# Patient Record
Sex: Male | Born: 1995 | Race: White | Hispanic: No | Marital: Single | Smoking: Current every day smoker
Health system: Southern US, Community
[De-identification: ages and names within clinical notes are randomized; demographics above are authoritative.]

## PROBLEM LIST (undated history)

## (undated) DIAGNOSIS — I10 Essential (primary) hypertension: Secondary | ICD-10-CM

---
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
## 2018-08-13 ENCOUNTER — Emergency Department
Admission: EM | Admit: 2018-08-13 | Discharge: 2018-08-13 | Disposition: A | Discharge status: Home / Self Care | Payer: Self-pay | Attending: Emergency Medicine | Admitting: Emergency Medicine

## 2018-08-13 ENCOUNTER — Other Ambulatory Visit: Payer: Self-pay

## 2018-08-13 ENCOUNTER — Encounter: Payer: Self-pay | Admitting: Emergency Medicine

## 2018-08-13 ENCOUNTER — Emergency Department: Payer: Self-pay

## 2018-08-13 DIAGNOSIS — F17210 Nicotine dependence, cigarettes, uncomplicated: Secondary | ICD-10-CM | POA: Insufficient documentation

## 2018-08-13 DIAGNOSIS — G8929 Other chronic pain: Secondary | ICD-10-CM | POA: Insufficient documentation

## 2018-08-13 DIAGNOSIS — M25561 Pain in right knee: Secondary | ICD-10-CM | POA: Insufficient documentation

## 2018-08-13 MED ORDER — MELOXICAM 15 MG PO TABS
15.0000 mg | ORAL_TABLET | Freq: Every day | ORAL | 0 refills | Status: AC
Start: 2018-08-13 — End: 2019-08-13

## 2018-08-13 NOTE — ED Notes (Signed)
See triage note  Presents with right knee pain  States pain is chronic but pain became worse today at work  No deformity noted

## 2018-08-13 NOTE — Discharge Instructions (Signed)
Follow-up with Dr. Rosita Kea who is on-call for orthopedics.  Also have your blood pressure rechecked as it was elevated at 154/102 initially in triage.  Begin taking meloxicam 50 mg 1 daily with food.  This medication will take approximately 5 to 7 days for full effect so you may also take Tylenol with this medication if needed for pain.  You may ice your knee as needed for discomfort.

## 2018-08-13 NOTE — ED Triage Notes (Signed)
Pt in via POV with complaints of chronic right knee pain, becoming worse today while at work.  Pt ambulatory to triabe, vitals WDL, NAD noted at this time.

## 2018-08-13 NOTE — ED Notes (Signed)
See triage note, right knee pain - chronic pain, worse with ambulation.

## 2018-08-13 NOTE — ED Provider Notes (Signed)
[facility] Emergency Department Provider Note  ____________________________________________   First MD Initiated Contact with Patient 08/13/18 1303     (approximate)  I have reviewed the triage vital signs and the nursing notes.   HISTORY  Chief Complaint Knee Pain   HPI Alan Mcbride is a 22 y.o. male presents to the emergency department with complaint of chronic knee pain for 6 months.  Patient states that today at work his pain become worse.  He has been taking ibuprofen without any relief.  He denies any previous injury to his knee and this is the first time he has had his knee evaluated.  Currently he rates his pain as 3 out of 10.  History reviewed. No pertinent past medical history.  There are no active problems to display for this patient.   History reviewed. No pertinent surgical history.  Prior to Admission medications   Medication Sig Start Date End Date Taking? Authorizing Provider  meloxicam (MOBIC) 15 MG tablet Take 1 tablet (15 mg total) by mouth daily. 08/13/18 08/13/19  Tommi Rumps, PA-C    Allergies Patient has no known allergies.  No family history on file.  Social History Social History   Tobacco Use  . Smoking status: Current Every Day Smoker    Packs/day: 0.50    Types: Cigarettes  . Smokeless tobacco: Never Used  Substance Use Topics  . Alcohol use: Yes  . Drug use: Never    Review of Systems Constitutional: No fever/chills Cardiovascular: Denies chest pain. Respiratory: Denies shortness of breath. Musculoskeletal: Positive for right knee pain. Skin: Negative for rash. Neurological: Negative for headaches, focal weakness or numbness. ____________________________________________   PHYSICAL EXAM:  VITAL SIGNS: ED Triage Vitals  Enc Vitals Group     BP 08/13/18 1252 (!) 154/102     Pulse Rate 08/13/18 1252 (!) 109     Resp 08/13/18 1252 12     Temp 08/13/18 1252 98.4 F (36.9 C)     Temp Source  08/13/18 1252 Oral     SpO2 08/13/18 1252 99 %     Weight 08/13/18 1252 150 lb (68 kg)     Height 08/13/18 1252 5\' 5"  (1.651 m)     Head Circumference --      Peak Flow --      Pain Score 08/13/18 1256 3     Pain Loc --      Pain Edu? --      Excl. in GC? --    Constitutional: Alert and oriented. Well appearing and in no acute distress. Eyes: Conjunctivae are normal.  Head: Atraumatic. Neck: No stridor.   Cardiovascular: Normal rate, regular rhythm. Grossly normal heart sounds.  Good peripheral circulation. Respiratory: Normal respiratory effort.  No retractions. Lungs CTAB. Musculoskeletal: On examination of the right knee there is no gross deformity however there is diffuse generalized tenderness.  There is no evidence of effusion.  No soft tissue swelling was noted.  No crepitus with range of motion and range of motion is not restricted.  Patient is ambulatory without any assistance.  There is no edema noted distal lower extremity and no skin discoloration noted. Neurologic:  Normal speech and language. No gross focal neurologic deficits are appreciated. No gait instability. Skin:  Skin is warm, dry and intact. No rash noted. Psychiatric: Mood and affect are normal. Speech and behavior are normal.  ____________________________________________   LABS (all labs ordered are listed, but only abnormal results are displayed)  Labs  Reviewed - No data to display  RADIOLOGY  ED MD interpretation:  Right knee x-ray is negative for acute bony injury.  Official radiology report(s): Dg Knee Complete 4 Views Right  Result Date: 08/13/2018 CLINICAL DATA:  Chronic right knee pain. EXAM: RIGHT KNEE - COMPLETE 4+ VIEW COMPARISON:  None. FINDINGS: No evidence of fracture, dislocation, or joint effusion. No evidence of arthropathy or other focal bone abnormality. Soft tissues are unremarkable. IMPRESSION: Negative. Electronically Signed   By: Sherian Rein M.D.   On: 08/13/2018 13:56     ____________________________________________   PROCEDURES  Procedure(s) performed: None  Procedures  Critical Care performed: No  ____________________________________________   INITIAL IMPRESSION / ASSESSMENT AND PLAN / ED COURSE  As part of my medical decision making, I reviewed the following data within the electronic MEDICAL RECORD NUMBER Notes from prior ED visits and [place] Controlled Substance Database  Patient presents with complaint of right knee pain for 6 months without a history of injury.  Patient has been taking ibuprofen without any relief.  X-rays are reassuring and patient was placed on meloxicam 15 mg 1 daily with food.  He is aware that he can take Tylenol with this and also use ice as needed for discomfort.  He is to follow-up with Dr. Rosita Kea if any continued problems with his knee for further evaluation.  ____________________________________________   FINAL CLINICAL IMPRESSION(S) / ED DIAGNOSES  Final diagnoses:  Chronic pain of right knee     ED Discharge Orders         Ordered    meloxicam (MOBIC) 15 MG tablet  Daily     08/13/18 1408           Note:  This document was prepared using Dragon voice recognition software and may include unintentional dictation errors.    Tommi Rumps, PA-C 08/13/18 1422    Governor Rooks, MD 08/15/18 [phone]

## 2019-02-23 ENCOUNTER — Other Ambulatory Visit: Payer: Self-pay

## 2019-02-23 ENCOUNTER — Emergency Department
Admission: EM | Admit: 2019-02-23 | Discharge: 2019-02-23 | Disposition: A | Discharge status: Home / Self Care | Payer: Medicaid Other | Attending: Emergency Medicine | Admitting: Emergency Medicine

## 2019-02-23 DIAGNOSIS — B9789 Other viral agents as the cause of diseases classified elsewhere: Secondary | ICD-10-CM

## 2019-02-23 DIAGNOSIS — J069 Acute upper respiratory infection, unspecified: Secondary | ICD-10-CM | POA: Insufficient documentation

## 2019-02-23 DIAGNOSIS — F17210 Nicotine dependence, cigarettes, uncomplicated: Secondary | ICD-10-CM | POA: Insufficient documentation

## 2019-02-23 DIAGNOSIS — Z79899 Other long term (current) drug therapy: Secondary | ICD-10-CM | POA: Insufficient documentation

## 2019-02-23 LAB — INFLUENZA PANEL BY PCR (TYPE A & B)
INFLAPCR: NEGATIVE
INFLBPCR: NEGATIVE

## 2019-02-23 MED ORDER — PSEUDOEPH-BROMPHEN-DM 30-2-10 MG/5ML PO SYRP: 5.0000 mL | ORAL_SOLUTION | Freq: Four times a day (QID) | ORAL | 0 refills | Status: DC | PRN

## 2019-02-23 NOTE — ED Triage Notes (Signed)
Congestion yesterday, cough today. A&O, ambulatory. No distress noted.

## 2019-02-23 NOTE — ED Notes (Signed)
Patient reports cough and sniffles/ congestion primarily in the morning upon waking up. Symptom began yesterday morning. Denies F/C/D/N/V. Awaiting md eval and plan of care.

## 2019-02-23 NOTE — ED Provider Notes (Signed)
[facility] Emergency Department Provider Note   ____________________________________________   First MD Initiated Contact with Patient 02/23/19 1418     (approximate)  I have reviewed the triage vital signs and the nursing notes.   HISTORY  Chief Complaint Cough and Nasal Congestion    HPI Alan Mcbride is a 23 y.o. male patient presents with nasal congestion and cough which started yesterday.  Patient also complained of body aches.  Patient denies nausea, vomiting, or diarrhea.  Patient is not had any foreign travel or come in contact with anyone with foreign travel.  Patient is not taken flu shot for this season.  Patient denies pain at this time.         History reviewed. No pertinent past medical history.  There are no active problems to display for this patient.   History reviewed. No pertinent surgical history.  Prior to Admission medications   Medication Sig Start Date End Date Taking? Authorizing Provider  brompheniramine-pseudoephedrine-DM 30-2-10 MG/5ML syrup Take 5 mLs by mouth 4 (four) times daily as needed. 02/23/19   Joni Reining, PA-C  meloxicam (MOBIC) 15 MG tablet Take 1 tablet (15 mg total) by mouth daily. 08/13/18 08/13/19  Tommi Rumps, PA-C    Allergies Patient has no known allergies.  History reviewed. No pertinent family history.  Social History Social History   Tobacco Use  . Smoking status: Current Every Day Smoker    Packs/day: 0.50    Types: Cigarettes  . Smokeless tobacco: Never Used  Substance Use Topics  . Alcohol use: Yes  . Drug use: Never    Review of Systems Constitutional: No fever/chills Eyes: No visual changes. ENT: Nasal congestion. Cardiovascular: Denies chest pain. Respiratory: Denies shortness of breath.  Nonproductive cough. Gastrointestinal: No abdominal pain.  No nausea, no vomiting.  No Diarrhea.  No constipation. Genitourinary: Negative for dysuria. Musculoskeletal:  Negative for back pain. Skin: Negative for rash. Neurological: Negative for headaches, focal weakness or numbness.   ____________________________________________   PHYSICAL EXAM:  VITAL SIGNS: ED Triage Vitals  Enc Vitals Group     BP 02/23/19 1410 (!) 173/87     Pulse Rate 02/23/19 1410 93     Resp 02/23/19 1410 15     Temp 02/23/19 1410 98.7 F (37.1 C)     Temp Source 02/23/19 1410 Oral     SpO2 02/23/19 1410 97 %     Weight 02/23/19 1411 170 lb (77.1 kg)     Height 02/23/19 1411 5\' 5"  (1.651 m)     Head Circumference --      Peak Flow --      Pain Score 02/23/19 1415 0     Pain Loc --      Pain Edu? --      Excl. in GC? --    Constitutional: Alert and oriented. Well appearing and in no acute distress. Nose: Clear rhinorrhea. Mouth/Throat: Mucous membranes are moist.  Oropharynx non-erythematous.  Postnasal drainage. Neck: No stridor. Cardiovascular: Normal rate, regular rhythm. Grossly normal heart sounds.  Good peripheral circulation.  Elevated blood pressure Respiratory: Normal respiratory effort.  No retractions. Lungs CTAB. Neurologic:  Normal speech and language. No gross focal neurologic deficits are appreciated. No gait instability. Skin:  Skin is warm, dry and intact. No rash noted. Psychiatric: Mood and affect are normal. Speech and behavior are normal.  ____________________________________________   LABS (all labs ordered are listed, but only abnormal results are displayed)  Labs Reviewed  INFLUENZA PANEL BY PCR (TYPE A & B)   ____________________________________________  EKG   ____________________________________________  RADIOLOGY  ED MD interpretation:    Official radiology report(s): No results found.  ____________________________________________   PROCEDURES  Procedure(s) performed (including Critical Care):  Procedures   ____________________________________________   INITIAL IMPRESSION / ASSESSMENT AND PLAN / ED COURSE   As part of my medical decision making, I reviewed the following data within the electronic MEDICAL RECORD NUMBER        Patient presents with nasal congestion cough started today.  Patient test negative for influenza.  Patient feels exam consistent with viral respiratory infection with cough.  Patient given discharge care instruction advised take medication as directed.  Patient advised [facility]      ____________________________________________   FINAL CLINICAL IMPRESSION(S) / ED DIAGNOSES  Final diagnoses:  Viral URI with cough     ED Discharge Orders         Ordered    brompheniramine-pseudoephedrine-DM 30-2-10 MG/5ML syrup  4 times daily PRN     02/23/19 1520           Note:  This document was prepared using Dragon voice recognition software and may include unintentional dictation errors.    Joni Reining, PA-C 02/23/19 1523    Don Perking, [place], MD 02/23/19 [phone]

## 2019-10-07 ENCOUNTER — Emergency Department: Payer: Medicaid Other

## 2019-10-07 ENCOUNTER — Emergency Department
Admission: EM | Admit: 2019-10-07 | Discharge: 2019-10-07 | Disposition: A | Discharge status: Home / Self Care | Payer: Medicaid Other | Attending: Emergency Medicine | Admitting: Emergency Medicine

## 2019-10-07 ENCOUNTER — Other Ambulatory Visit: Payer: Self-pay

## 2019-10-07 DIAGNOSIS — M25561 Pain in right knee: Secondary | ICD-10-CM | POA: Insufficient documentation

## 2019-10-07 DIAGNOSIS — F17210 Nicotine dependence, cigarettes, uncomplicated: Secondary | ICD-10-CM | POA: Insufficient documentation

## 2019-10-07 MED ORDER — NAPROXEN 500 MG PO TABS: 500.0000 mg | ORAL_TABLET | Freq: Two times a day (BID) | ORAL | 0 refills | Status: AC

## 2019-10-07 NOTE — ED Provider Notes (Signed)
[facility] Emergency Department Provider Note   ____________________________________________   First MD Initiated Contact with Patient 10/07/19 1237     (approximate)  I have reviewed the triage vital signs and the nursing notes.   HISTORY  Chief Complaint Knee Pain    HPI Alan Mcbride is a 23 y.o. male presents to the ED with complaint of right knee pain.  Patient states that he fell from his skateboard approximately 2 weeks ago and has continued to have pain.  He states that he did not actually hit the ground but more likely he did a split twisting his knee at the same time.  He denies any head injury or loss of consciousness during this event.  Patient has infrequently taken  over-the-counter medication without any relief.  He is continued to be ambulatory since this.  He rates his pain as a 1 out of 10.      History reviewed. No pertinent past medical history.  There are no active problems to display for this patient.   History reviewed. No pertinent surgical history.  Prior to Admission medications   Medication Sig Start Date End Date Taking? Authorizing Provider  naproxen (NAPROSYN) 500 MG tablet Take 1 tablet (500 mg total) by mouth 2 (two) times daily with a meal. 10/07/19   Tommi Rumps, PA-C    Allergies Patient has no known allergies.  No family history on file.  Social History Social History   Tobacco Use  . Smoking status: Current Every Day Smoker    Packs/day: 0.50    Types: Cigarettes  . Smokeless tobacco: Never Used  Substance Use Topics  . Alcohol use: Yes  . Drug use: Never    Review of Systems Constitutional: No fever/chills Cardiovascular: Denies chest pain. Respiratory: Denies shortness of breath. Gastrointestinal: No abdominal pain.  No nausea, no vomiting. Musculoskeletal: Positive for right knee pain. Skin: Negative for rash. Neurological: Negative for headaches, focal weakness or numbness.  ____________________________________________   PHYSICAL EXAM:  VITAL SIGNS: ED Triage Vitals [10/07/19 1219]  Enc Vitals Group     BP 116/71     Pulse Rate (!) 106     Resp 18     Temp 98.8 F (37.1 C)     Temp Source Oral     SpO2 100 %     Weight 140 lb (63.5 kg)     Height 5\' 4"  (1.626 m)     Head Circumference      Peak Flow      Pain Score 1     Pain Loc      Pain Edu?      Excl. in GC?     Constitutional: Alert and oriented. Well appearing and in no acute distress. Eyes: Conjunctivae are normal.  Head: Atraumatic. Neck: No stridor.   Cardiovascular: Normal rate, regular rhythm. Grossly normal heart sounds.  Good peripheral circulation. Respiratory: Normal respiratory effort.  No retractions. Lungs CTAB. Musculoskeletal: On examination of right knee there is some medial tenderness on palpation and also with stress.  No patella effusion or soft tissue edema is present.  No crepitus with range of motion.  Patient has no limitation with range of motion. Neurologic:  Normal speech and language. No gross focal neurologic deficits are appreciated. No gait instability. Skin:  Skin is warm, dry and intact.  No ecchymosis or abrasions were seen. Psychiatric: Mood and affect are normal. Speech and behavior are normal.  ____________________________________________   LABS (all  labs ordered are listed, but only abnormal results are displayed)  Labs Reviewed - No data to display  RADIOLOGY  ED MD interpretation:  Right knee x-ray is negative for acute bony injury.  Official radiology report(s): Dg Knee Complete 4 Views Right  Result Date: 10/07/2019 CLINICAL DATA:  Right knee pain after injury 2 weeks ago. EXAM: RIGHT KNEE - COMPLETE 4+ VIEW COMPARISON:  August 13, 2018. FINDINGS: No evidence of fracture, dislocation, or joint effusion. No evidence of arthropathy or other focal bone abnormality. Soft tissues are unremarkable. IMPRESSION: Negative. Electronically Signed    By: Marijo Conception M.D.   On: 10/07/2019 14:01    ____________________________________________   PROCEDURES  Procedure(s) performed (including Critical Care):  Procedures Knee immobilizer was applied by the ED tech Scott.  ____________________________________________   INITIAL IMPRESSION / ASSESSMENT AND PLAN / ED COURSE  As part of my medical decision making, I reviewed the following data within the electronic MEDICAL RECORD NUMBER Notes from prior ED visits and [place] Controlled Substance Database  23 year old male presents to the ED with complaint of right knee pain for the last 2 weeks.  Patient states that he fell from his skateboard and basically did a split fall also twisting his right knee.  There is medial tenderness on palpation of the right knee.  Ligaments were stable but pain was increased while stressing bilaterally.  X-ray was reassuring and patient was made aware that he did not have a fracture.  He was placed in a knee immobilizer and started on naproxen 500 mg twice daily with food.  He is to follow-up with Dr. Roland Rack if not improving. ____________________________________________   FINAL CLINICAL IMPRESSION(S) / ED DIAGNOSES  Final diagnoses:  Acute pain of right knee     ED Discharge Orders         Ordered    naproxen (NAPROSYN) 500 MG tablet  2 times daily with meals     10/07/19 1412           Note:  This document was prepared using Dragon voice recognition software and may include unintentional dictation errors.    Johnn Hai, PA-C 10/07/19 1422    Delman Kitten, MD 10/07/19 [phone]

## 2019-10-07 NOTE — ED Triage Notes (Signed)
Right knee pain X 2 week ago, continued pain with walking. States popping noise at time of injury. Pt alert and oriented X4, cooperative, RR even and unlabored, color WNL. Pt in NAD.

## 2019-10-07 NOTE — Discharge Instructions (Addendum)
Follow-up with Dr. Roland Rack if any continued problems or concerns.  You will need to call and make an appointment with the information listed on your discharge papers.  You may apply ice to your knee as needed.  Begin taking naproxen 500 mg twice daily every day with food.  Wear the knee immobilizer when you are up walking.  You do not need to sleep with it on.  Wear this for approximately 7 days.

## 2019-10-07 NOTE — ED Notes (Signed)
See triage note  Presents with right knee pain  States he fell from skateboard about 2 weeks ago   conts to have pain to lateral aspect to knee  No swelling ntoed

## 2020-11-27 IMAGING — DX DG KNEE COMPLETE 4+V*R*
4 series · 4 of 4 positions shown · non-contrast
Comparison: August 13, 2018.

CLINICAL DATA: Right knee pain after injury 2 weeks ago.

EXAM:
RIGHT KNEE - COMPLETE 4+ VIEW

[knee ap]
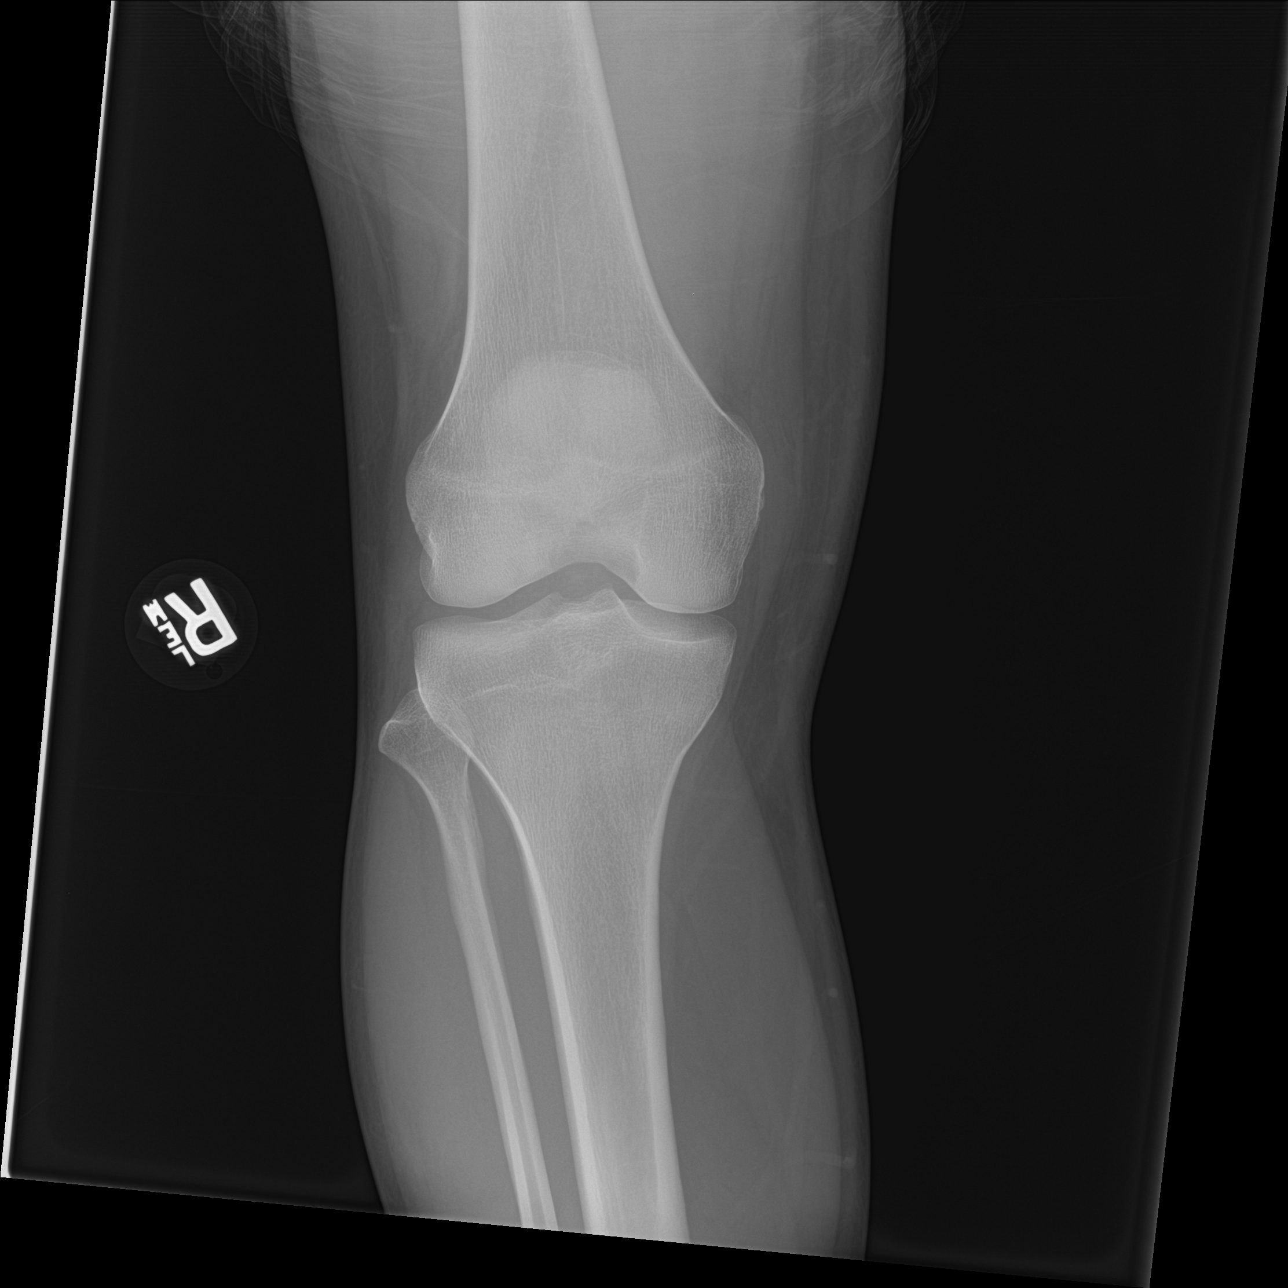

[knee lat]
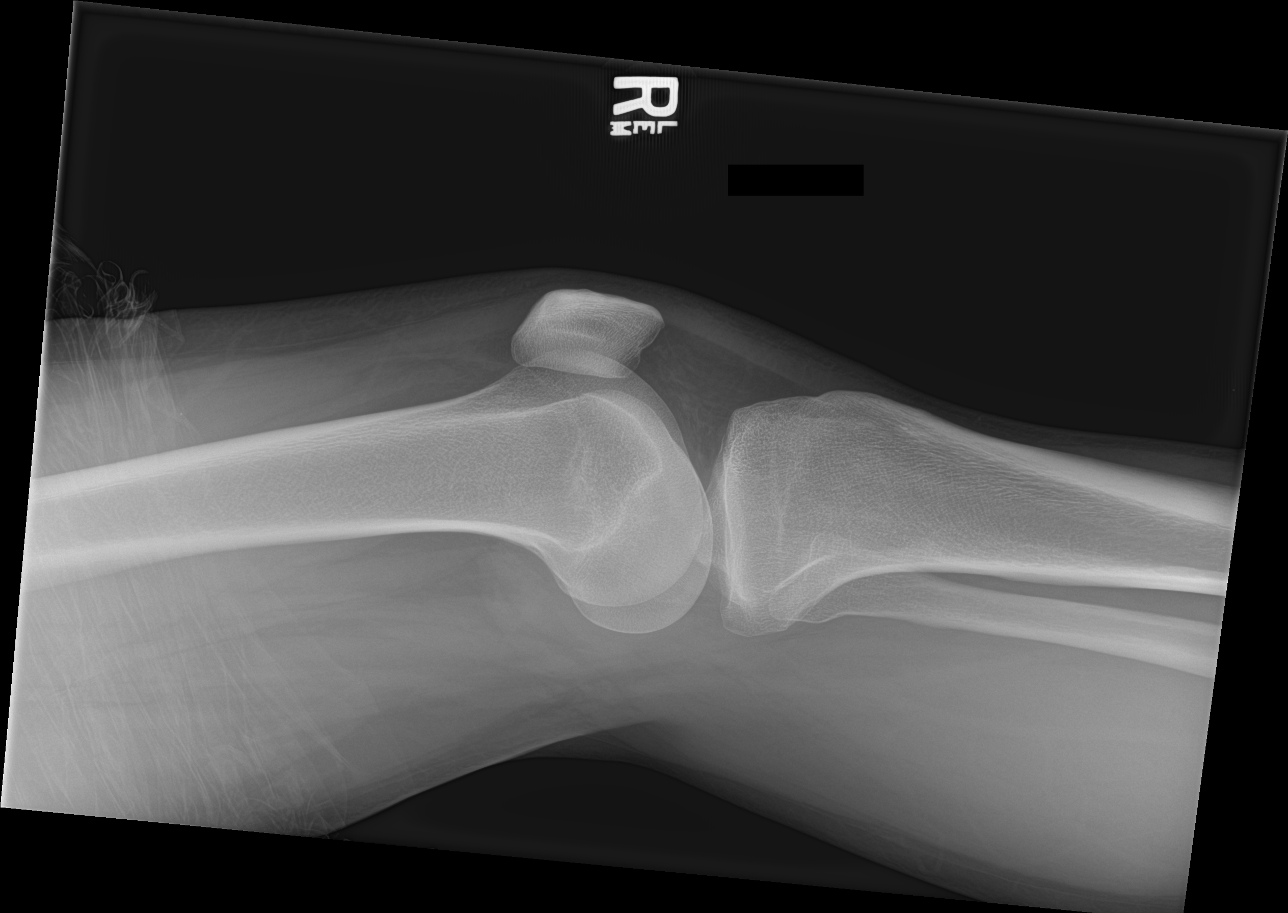

[knee obl (1 of 2)]
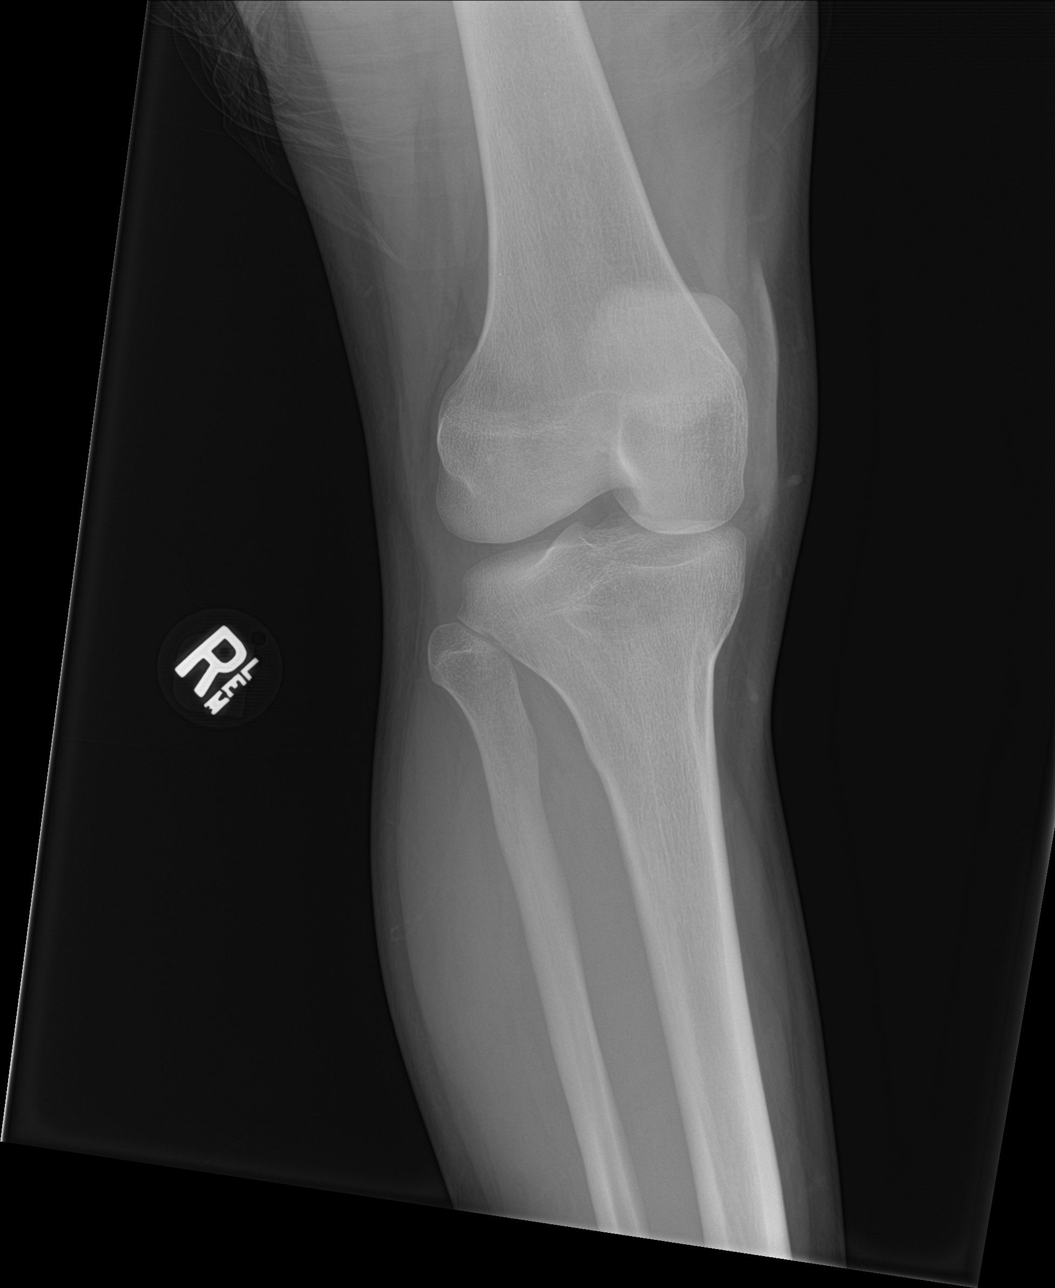

[knee obl (2 of 2)]
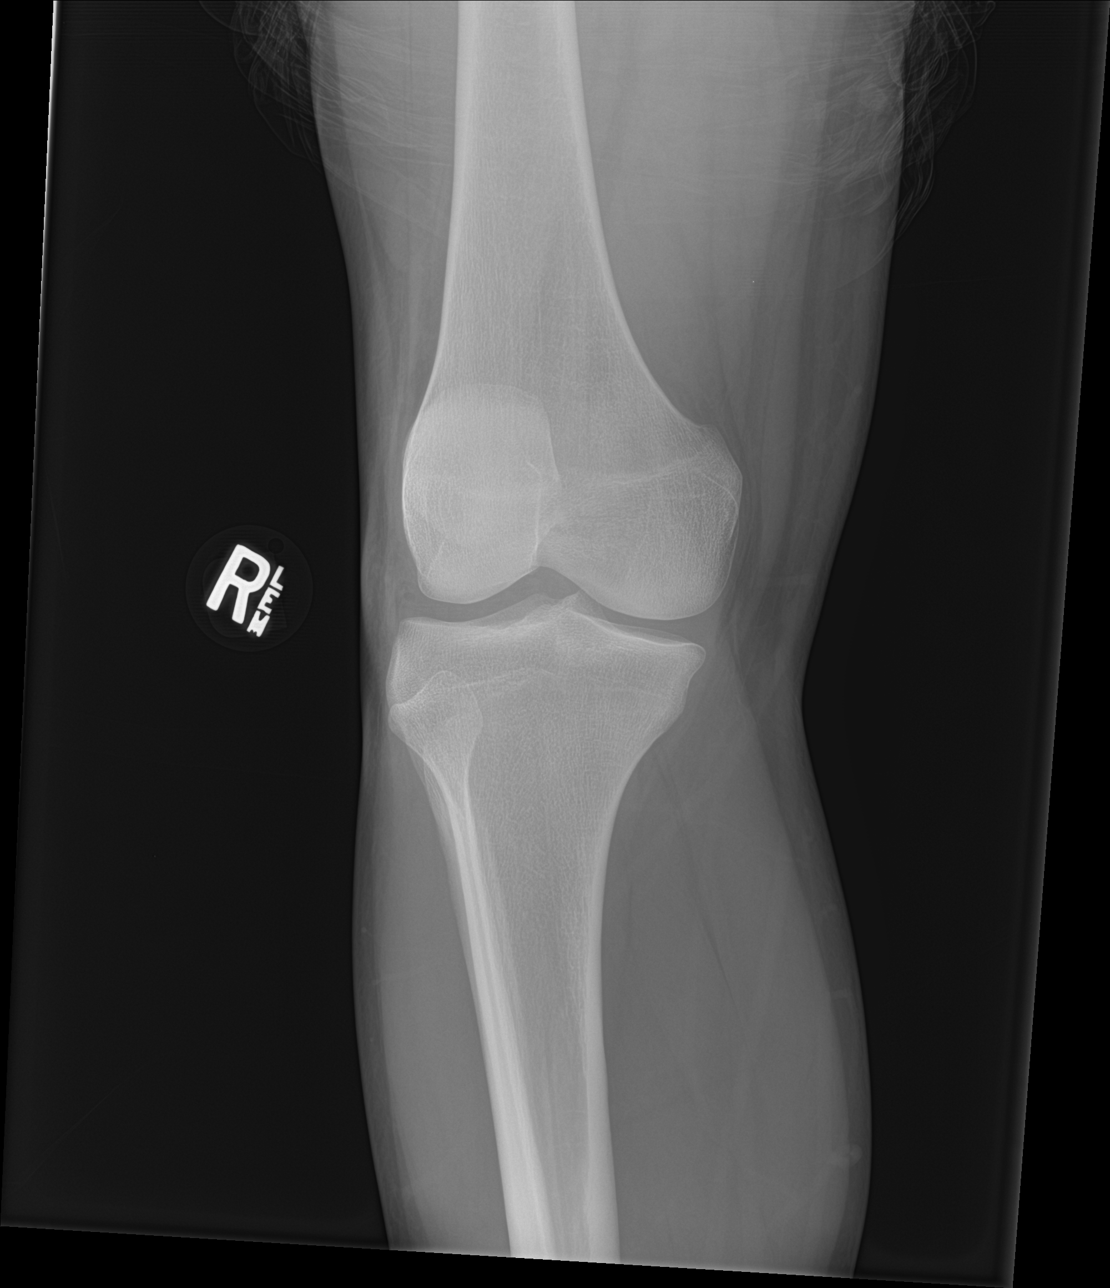

[4 of 4 positions shown; findings below may reference images not displayed]

FINDINGS: No evidence of fracture, dislocation, or joint effusion. No evidence
of arthropathy or other focal bone abnormality. Soft tissues are
unremarkable.
IMPRESSION: Negative.

## 2021-08-03 ENCOUNTER — Other Ambulatory Visit: Payer: Self-pay

## 2021-08-03 ENCOUNTER — Encounter: Payer: Self-pay | Admitting: Intensive Care

## 2021-08-03 ENCOUNTER — Emergency Department
Admission: EM | Admit: 2021-08-03 | Discharge: 2021-08-03 | Disposition: A | Discharge status: Home / Self Care | Payer: Medicaid Other | Attending: Emergency Medicine | Admitting: Emergency Medicine

## 2021-08-03 DIAGNOSIS — F17290 Nicotine dependence, other tobacco product, uncomplicated: Secondary | ICD-10-CM | POA: Insufficient documentation

## 2021-08-03 DIAGNOSIS — U071 COVID-19: Secondary | ICD-10-CM | POA: Insufficient documentation

## 2021-08-03 LAB — RESP PANEL BY RT-PCR (FLU A&B, COVID) ARPGX2
Influenza A by PCR: NEGATIVE
Influenza B by PCR: NEGATIVE
SARS Coronavirus 2 by RT PCR: POSITIVE — AB

## 2021-08-03 MED ORDER — IBUPROFEN 400 MG PO TABS
400.0000 mg | ORAL_TABLET | Freq: Once | ORAL | Status: AC | PRN
  Administered 2021-08-03: 400 mg via ORAL
  Filled 2021-08-03: qty 1

## 2021-08-03 NOTE — ED Triage Notes (Signed)
Patient presents with fever and body aches. Last took theraflu about an hour ago. Symptoms started yesterday

## 2021-08-03 NOTE — ED Provider Notes (Signed)
[facility] Emergency Department Provider Note ____________________________________________   None    (approximate)  I have reviewed the triage vital signs and the nursing notes.   HISTORY  Chief Complaint Fever and Generalized Body Aches  HPI Alan Mcbride is a 25 y.o. male with history of no chronic medical history presents to the emergency department for treatment and evaluation of fever, headache, sore throat since yesterday.  Symptoms progressively worsened today.  No exposure to others who have been ill that he is aware of.  No alleviating measures attempted prior to arrival..         History reviewed. No pertinent past medical history.  There are no problems to display for this patient.   History reviewed. No pertinent surgical history.  Prior to Admission medications   Medication Sig Start Date End Date Taking? Authorizing Provider  naproxen (NAPROSYN) 500 MG tablet Take 1 tablet (500 mg total) by mouth 2 (two) times daily with a meal. 10/07/19   Tommi Rumps, PA-C    Allergies Patient has no known allergies.  History reviewed. No pertinent family history.  Social History Social History   Tobacco Use   Smoking status: Every Day    Types: E-cigarettes   Smokeless tobacco: Never  Vaping Use   Vaping Use: Never used  Substance Use Topics   Alcohol use: Yes    Comment: occ   Drug use: Never    Review of Systems  Constitutional: Positive for fever/chills Eyes: No visual changes. ENT: Positive for sore throat. Cardiovascular: Denies chest pain. Respiratory: Denies shortness of breath. Gastrointestinal: No abdominal pain.  No nausea, no vomiting.  No diarrhea.  No constipation. Genitourinary: Negative for dysuria. Musculoskeletal: Positive for body aches Skin: Negative for rash. Neurological: Positive for headaches, negative for focal weakness or numbness.  ____________________________________________   PHYSICAL  EXAM:  VITAL SIGNS: ED Triage Vitals  Enc Vitals Group     BP 08/03/21 1805 128/83     Pulse Rate 08/03/21 1805 (!) 120     Resp 08/03/21 1805 16     Temp 08/03/21 1805 (!) 100.5 F (38.1 C)     Temp Source 08/03/21 1805 Oral     SpO2 08/03/21 1805 97 %     Weight 08/03/21 1807 160 lb (72.6 kg)     Height 08/03/21 1807 5\' 5"  (1.651 m)     Head Circumference --      Peak Flow --      Pain Score 08/03/21 1806 2     Pain Loc --      Pain Edu? --      Excl. in GC? --     Constitutional: Alert and oriented. Well appearing and in no acute distress. Eyes: Conjunctivae are normal. PERRL.  Head: Atraumatic. Nose: No congestion/rhinnorhea. Mouth/Throat: Mucous membranes are moist.  Oropharynx erythematous.  No exudate on tonsils. Neck: No stridor.   Hematological/Lymphatic/Immunilogical: No cervical lymphadenopathy. Cardiovascular: Normal rate, regular rhythm. Grossly normal heart sounds.  Good peripheral circulation. Respiratory: Normal respiratory effort.  No retractions. Lungs CTAB. Gastrointestinal: Soft and nontender. No distention. No abdominal bruits. No CVA tenderness. Genitourinary:  Musculoskeletal: No lower extremity tenderness nor edema.  No joint effusions. Neurologic:  Normal speech and language. No gross focal neurologic deficits are appreciated. No gait instability. Skin:  Skin is warm, dry and intact. No rash noted. Psychiatric: Mood and affect are normal. Speech and behavior are normal.  ____________________________________________   LABS (all labs ordered are listed,  but only abnormal results are displayed)  Labs Reviewed  RESP PANEL BY RT-PCR (FLU A&B, COVID) ARPGX2 - Abnormal; Notable for the following components:      Result Value   SARS Coronavirus 2 by RT PCR POSITIVE (*)    All other components within normal limits   ____________________________________________  EKG  Not indicated ____________________________________________  RADIOLOGY  ED MD  interpretation:    Not indicated I, Kem Boroughs, personally viewed and evaluated these images (plain radiographs) as part of my medical decision making, as well as reviewing the written report by the radiologist.  Official radiology report(s): No results found.  ____________________________________________   PROCEDURES  Procedure(s) performed (including Critical Care):  Procedures  ____________________________________________   INITIAL IMPRESSION / ASSESSMENT AND PLAN     25 year old male presenting to the emergency department for treatment and evaluation of symptoms as described in the HPI.  Symptoms most consistent with COVID.  Will test.  DIFFERENTIAL DIAGNOSIS  COVID-19, viral URI, influenza  ED COURSE  COVID-19 positive. Fever and tachycardia improving.  Patient will be discharged home and given a work note for the week.  He was encouraged to take Tylenol or ibuprofen for headache and fever.  He is to follow-up with primary care or return to the emergency department for symptoms of concern.    ___________________________________________   FINAL CLINICAL IMPRESSION(S) / ED DIAGNOSES  Final diagnoses:  COVID     ED Discharge Orders     None        Alan Mcbride was evaluated in Emergency Department on 08/03/2021 for the symptoms described in the history of present illness. He was evaluated in the context of the global COVID-19 pandemic, which necessitated consideration that the patient might be at risk for infection with the SARS-CoV-2 virus that causes COVID-19. Institutional protocols and algorithms that pertain to the evaluation of patients at risk for COVID-19 are in a state of rapid change based on information released by regulatory bodies including the CDC and federal and state organizations. These policies and algorithms were followed during the patient's care in the ED.   Note:  This document was prepared using Dragon voice recognition software  and may include unintentional dictation errors.    Chinita Pester, FNP 08/03/21 2045    Minna Antis, MD 08/03/21 2326

## 2021-08-03 NOTE — Discharge Instructions (Addendum)
Rest, increase fluid intake.  Take tylenol or ibuprofen for headache, fever, and bodyaches. Robitussin should help with cough.  Follow up with primary care or return to the ER for concerns.

## 2022-12-18 ENCOUNTER — Ambulatory Visit: Payer: Medicaid Other | Admitting: Gerontology

## 2022-12-18 NOTE — Progress Notes (Deleted)
   New Patient Office Visit  Subjective    Patient ID: Alan Mcbride, male    DOB: 09/26/1996  Age: 27 y.o. MRN: [id number]  CC: No chief complaint on file.   HPI Alan Mcbride presents to establish care ***  Outpatient Encounter Medications as of 12/18/2022  Medication Sig   naproxen (NAPROSYN) 500 MG tablet Take 1 tablet (500 mg total) by mouth 2 (two) times daily with a meal.   No facility-administered encounter medications on file as of 12/18/2022.    No past medical history on file.  No past surgical history on file.  No family history on file.  Social History   Socioeconomic History   Marital status: Single    Spouse name: Not on file   Number of children: Not on file   Years of education: Not on file   Highest education level: Not on file  Occupational History   Not on file  Tobacco Use   Smoking status: Every Day    Types: E-cigarettes   Smokeless tobacco: Never  Vaping Use   Vaping Use: Never used  Substance and Sexual Activity   Alcohol use: Yes    Comment: occ   Drug use: Never   Sexual activity: Not on file  Other Topics Concern   Not on file  Social History Narrative   Not on file   Social Determinants of Health   Financial Resource Strain: Not on file  Food Insecurity: Not on file  Transportation Needs: Not on file  Physical Activity: Not on file  Stress: Not on file  Social Connections: Not on file  Intimate Partner Violence: Not on file    ROS      Objective    There were no vitals taken for this visit.  Physical Exam  {Labs (Optional):23779}    Assessment & Plan:   Problem List Items Addressed This Visit   None   No follow-ups on file.   Jennice Renegar Jerold Coombe, NP

## 2023-04-07 ENCOUNTER — Other Ambulatory Visit: Payer: Self-pay

## 2023-04-07 ENCOUNTER — Emergency Department
Admission: EM | Admit: 2023-04-07 | Discharge: 2023-04-07 | Disposition: A | Discharge status: Home / Self Care | Payer: Medicaid Other | Attending: Emergency Medicine | Admitting: Emergency Medicine

## 2023-04-07 DIAGNOSIS — R21 Rash and other nonspecific skin eruption: Secondary | ICD-10-CM | POA: Diagnosis present

## 2023-04-07 DIAGNOSIS — L249 Irritant contact dermatitis, unspecified cause: Secondary | ICD-10-CM | POA: Insufficient documentation

## 2023-04-07 MED ORDER — HYDROCORTISONE 0.5 % EX CREA: 1.0000 | TOPICAL_CREAM | Freq: Every day | CUTANEOUS | 0 refills | Status: AC | PRN

## 2023-04-07 MED ORDER — TRIAMCINOLONE ACETONIDE 0.5 % EX OINT: 1.0000 | TOPICAL_OINTMENT | Freq: Two times a day (BID) | CUTANEOUS | 0 refills | Status: AC

## 2023-04-07 NOTE — ED Provider Notes (Signed)
[facility] Provider Note    Event Date/Time   First MD Initiated Contact with Patient 04/07/23 1722     (approximate)   History   Rash   HPI  Alan Mcbride is a 27 y.o. male who presents today for evaluation of rash to his bilateral arms, neck, and face.  Patient reports that 2 days ago he was working outside and came into contact with poison ivy.  He reports that he has had poison ivy multiple times in the past and his symptoms today feel exactly the same.  He reports that it is very itchy.  He has not had any trouble breathing or swallowing.  He has been using calamine lotion with some improvement of his symptoms.  Fevers or chills.  No involvement of his torso or lower extremities.  There are no problems to display for this patient.         Physical Exam   Triage Vital Signs: ED Triage Vitals  Enc Vitals Group     BP 04/07/23 1624 (!) 149/104     Pulse Rate 04/07/23 1624 100     Resp 04/07/23 1624 19     Temp 04/07/23 1624 98.1 F (36.7 C)     Temp src --      SpO2 04/07/23 1624 95 %     Weight --      Height --      Head Circumference --      Peak Flow --      Pain Score 04/07/23 1622 2     Pain Loc --      Pain Edu? --      Excl. in GC? --     Most recent vital signs: Vitals:   04/07/23 1624  BP: (!) 149/104  Pulse: 100  Resp: 19  Temp: 98.1 F (36.7 C)  SpO2: 95%    Physical Exam Vitals and nursing note reviewed.  Constitutional:      General: Awake and alert. No acute distress.    Appearance: Normal appearance. The patient is normal weight.  HENT:     Head: Normocephalic and atraumatic.     Mouth: Mucous membranes are moist. No involvement of oral mucosa Eyes:     General: PERRL. Normal EOMs        Right eye: No discharge.        Left eye: No discharge.     Conjunctiva/sclera: Conjunctivae normal.  Cardiovascular:     Rate and Rhythm: Normal rate and regular rhythm.     Pulses: Normal pulses.  Pulmonary:      Effort: Pulmonary effort is normal. No respiratory distress.     Breath sounds: Normal breath sounds.  Abdominal:     Abdomen is soft. There is no abdominal tenderness. No rebound or guarding. No distention. Musculoskeletal:        General: No swelling. Normal range of motion.     Cervical back: Normal range of motion and neck supple.  Skin:    General: Skin is warm and dry.     Capillary Refill: Capillary refill takes less than 2 seconds.     Findings: Erythematous rash with papules noted to bilateral forearms and dorsum of hands. Small cluster noted to right lateral neck. No obvious facial involvement, though slight erythema with roughened skin to right cheek. No bullae or vesicles  Neurological:     Mental Status: The patient is awake and alert.      ED Results /  Procedures / Treatments   Labs (all labs ordered are listed, but only abnormal results are displayed) Labs Reviewed - No data to display   EKG     RADIOLOGY     PROCEDURES:  Critical Care performed:   Procedures   MEDICATIONS ORDERED IN ED: Medications - No data to display   IMPRESSION / MDM / ASSESSMENT AND PLAN / ED COURSE  I reviewed the triage vital signs and the nursing notes.   Differential diagnosis includes, but is not limited to, contact dermatitis, allergic reaction, not consistent with shingles.  Patient is awake and alert, hemodynamically stable and afebrile.  There is no oral or mucosal involvement.  Patient has a history of working with poison ivy, and then developing this rash the next day, symptoms most consistent with contact dermatitis from poison ivy.  He has had this before, reports that it feels exactly the same.  He was given a steroid for his arms.  I discussed the precautions of using a steroid on his face and strongly advised against this.  However he has requested a low potency steroid for his face as this has worked for him in the past.  He was given a triamcinolone for his  arms, and a further cortisone for his face.  He was advised to use this sparingly given the risk of skin thinning to his face.  He was also advised that neither of these should be applied to his genitals.  Currently there are no signs of superimposed infection.  There are no bullae, vesicles, mucosal findings, hemodynamic instability, not consistent with SJS or TENS.  Bilateral nature not consistent with shingles.  We discussed strict return precautions and importance of close outpatient follow-up.  Patient understands and agrees with plan.  He was discharged in stable condition.   Patient's presentation is most consistent with acute illness / injury with system symptoms.   FINAL CLINICAL IMPRESSION(S) / ED DIAGNOSES   Final diagnoses:  Rash  Irritant contact dermatitis, unspecified trigger     Rx / DC Orders   ED Discharge Orders          Ordered    triamcinolone ointment (KENALOG) 0.5 %  2 times daily        04/07/23 1727    hydrocortisone cream 0.5 %  Daily PRN        04/07/23 1727             Note:  This document was prepared using Dragon voice recognition software and may include unintentional dictation errors.   Keturah Shavers 04/07/23 2030    Minna Antis, MD 04/07/23 2121

## 2023-04-07 NOTE — ED Triage Notes (Signed)
Pt comes with c/o possible poison ivy. Pt states this all started two days ago. Pt states it started on arms and has now progressed to all over. Pt put calomine on to see if it would help

## 2023-04-07 NOTE — Discharge Instructions (Addendum)
You may use the ointments as prescribed. Do not apply the triamcinolone to your face. Use the hydrocortisone sparingly. You may also take oatmeal baths to help with the itching, as well as cool, wet compresses. Return for any new, worsening or change in symptoms or other concerns.

## 2024-03-07 ENCOUNTER — Other Ambulatory Visit: Payer: Self-pay

## 2024-03-07 ENCOUNTER — Emergency Department
Admission: EM | Admit: 2024-03-07 | Discharge: 2024-03-07 | Disposition: A | Discharge status: Home / Self Care | Attending: Emergency Medicine | Admitting: Emergency Medicine

## 2024-03-07 ENCOUNTER — Emergency Department

## 2024-03-07 DIAGNOSIS — R079 Chest pain, unspecified: Secondary | ICD-10-CM | POA: Insufficient documentation

## 2024-03-07 DIAGNOSIS — R0602 Shortness of breath: Secondary | ICD-10-CM | POA: Insufficient documentation

## 2024-03-07 HISTORY — DX: Essential (primary) hypertension: I10

## 2024-03-07 LAB — BASIC METABOLIC PANEL WITH GFR
Anion gap: 10 (ref 5–15)
BUN: 11 mg/dL (ref 6–20)
CO2: 24 mmol/L (ref 22–32)
Calcium: 9.2 mg/dL (ref 8.9–10.3)
Chloride: 105 mmol/L (ref 98–111)
Creatinine, Ser: 0.98 mg/dL (ref 0.61–1.24)
GFR, Estimated: >60 mL/min (ref >60)
Glucose, Bld: 119 mg/dL — ABNORMAL HIGH (ref 70–99)
Potassium: 3.7 mmol/L (ref 3.5–5.1)
Sodium: 139 mmol/L (ref 135–145)

## 2024-03-07 LAB — CBC
HCT: 46.6 % (ref 39.0–52.0)
Hemoglobin: 16.6 g/dL (ref 13.0–17.0)
MCH: 31 pg (ref 26.0–34.0)
MCHC: 35.6 g/dL (ref 30.0–36.0)
MCV: 86.9 fL (ref 80.0–100.0)
Platelets: 309 10*3/uL (ref 150–400)
RBC: 5.36 MIL/uL (ref 4.22–5.81)
RDW: 11.9 % (ref 11.5–15.5)
WBC: 6.6 10*3/uL (ref 4.0–10.5)
nRBC: 0 % (ref 0.0–0.2)

## 2024-03-07 LAB — TROPONIN I (HIGH SENSITIVITY)
Troponin I (High Sensitivity): 3 ng/L (ref <18)
Troponin I (High Sensitivity): <2 ng/L (ref <18)

## 2024-03-07 LAB — D-DIMER, QUANTITATIVE: D-Dimer, Quant: <0.27 ug{FEU}/mL (ref 0.00–0.50)

## 2024-03-07 NOTE — ED Triage Notes (Signed)
 Pt sts that he was at work and started to feel SOB and CP. Pt sts that he did take a ginseng extract yesterday to give him more energy and wonders if that had anything to do with it.

## 2024-03-07 NOTE — ED Provider Notes (Signed)
 [facility] Provider Note    Event Date/Time   First MD Initiated Contact with Patient 03/07/24 1108     (approximate)   History   Chest Pain   HPI Alan Mcbride is a 28 y.o. male presenting today for chest pain and shortness of breath.  Patient states after waking up this morning he was getting ready for work when he had onset of centralized chest pain associated with some shortness of breath.  Symptoms lasted approximately an hour and are no longer present at this time.  Otherwise denies any recent fever, cough, congestion, shortness of breath, nausea, vomiting, lightheadedness, diaphoresis, abdominal pain.  Does have prior history of panic attacks but stated this 1 felt different than those.  Daily vape use.  No other prior cardiac history.     Physical Exam   Triage Vital Signs: ED Triage Vitals  Encounter Vitals Group     BP 03/07/24 1032 (!) 156/98     Systolic BP Percentile --      Diastolic BP Percentile --      Pulse Rate 03/07/24 1032 (!) 105     Resp 03/07/24 1032 16     Temp 03/07/24 1032 98.3 F (36.8 C)     Temp Source 03/07/24 1032 Oral     SpO2 03/07/24 1032 95 %     Weight 03/07/24 1030 170 lb (77.1 kg)     Height 03/07/24 1030 5\' 5"  (1.651 m)     Head Circumference --      Peak Flow --      Pain Score 03/07/24 1030 1     Pain Loc --      Pain Education --      Exclude from Growth Chart --     Most recent vital signs: Vitals:   03/07/24 1032  BP: (!) 156/98  Pulse: (!) 105  Resp: 16  Temp: 98.3 F (36.8 C)  SpO2: 95%   Physical Exam: I have reviewed the vital signs and nursing notes. General: Awake, alert, no acute distress.  Nontoxic appearing. Head:  Atraumatic, normocephalic.   ENT:  EOM intact, PERRL. Oral mucosa is pink and moist with no lesions. Neck: Neck is supple with full range of motion, No meningeal signs. Cardiovascular:  RRR, No murmurs. Peripheral pulses palpable and equal  bilaterally. Respiratory:  Symmetrical chest wall expansion.  No rhonchi, rales, or wheezes.  Good air movement throughout.  No use of accessory muscles.   Musculoskeletal:  No cyanosis or edema. Moving extremities with full ROM Abdomen:  Soft, nontender, nondistended. Neuro:  GCS 15, moving all four extremities, interacting appropriately. Speech clear. Psych:  Calm, appropriate.   Skin:  Warm, dry, no rash.    ED Results / Procedures / Treatments   Labs (all labs ordered are listed, but only abnormal results are displayed) Labs Reviewed  BASIC METABOLIC PANEL WITH GFR - Abnormal; Notable for the following components:      Result Value   Glucose, Bld 119 (*)    All other components within normal limits  CBC  D-DIMER, QUANTITATIVE  TROPONIN I (HIGH SENSITIVITY)  TROPONIN I (HIGH SENSITIVITY)     EKG My EKG interpretation: Rate of 108, sinus tachycardia, right axis deviation.  Normal intervals.  T wave inversion present in lead III with no comparison.  No acute ST elevations or depressions.   RADIOLOGY Independently interpreted chest x-ray with no acute pathology   PROCEDURES:  Critical Care performed: No  Procedures  MEDICATIONS ORDERED IN ED: Medications - No data to display   IMPRESSION / MDM / ASSESSMENT AND PLAN / ED COURSE  I reviewed the triage vital signs and the nursing notes.                              Differential diagnosis includes, but is not limited to, panic attack, reflux, pneumonia, pneumothorax, lower suspicion ACS or PE  Patient's presentation is most consistent with acute complicated illness / injury requiring diagnostic workup.  Patient is a 27 year old male presenting today for brief episode of chest pressure associated with shortness of breath.  On arrival he is now asymptomatic.  Did have initial tachycardia but vital signs otherwise stable.  Exam completely unremarkable at this time.  EKG mostly reassuring with only T wave inversion  present in lead III but no other prior comparison and no other obvious ischemic findings.  Chest x-ray with no acute pathology.  CBC and BMP unremarkable.  Troponin negative x 2 and D-dimer also negative.  Patient was reassessed and continued to deny any symptoms.  He thinks it was most likely anxiety related.  No other emergent pathology noted today and safe for discharge.  Given strict return precautions.     FINAL CLINICAL IMPRESSION(S) / ED DIAGNOSES   Final diagnoses:  Chest pain, unspecified type     Rx / DC Orders   ED Discharge Orders     None        Note:  This document was prepared using Dragon voice recognition software and may include unintentional dictation errors.   Janith Lima, MD 03/07/24 [phone]

## 2024-03-07 NOTE — Discharge Instructions (Signed)
 Markers for your heart and your other blood work was all unremarkable today.  Chest x-ray shows no acute abnormalities.  Please return for any worsening symptoms.
